# Patient Record
Sex: Female | Born: 1980 | Race: Black or African American | Hispanic: No | Marital: Married | Smoking: Never smoker
Health system: Southern US, Community
[De-identification: ages and names within clinical notes are randomized; demographics above are authoritative.]

## PROBLEM LIST (undated history)

## (undated) DIAGNOSIS — I10 Essential (primary) hypertension: Secondary | ICD-10-CM

## (undated) HISTORY — PX: ABDOMINAL HYSTERECTOMY: SHX81

---

## 2015-09-05 ENCOUNTER — Emergency Department (HOSPITAL_COMMUNITY)
Admission: EM | Admit: 2015-09-05 | Discharge: 2015-09-05 | Disposition: A | Discharge status: Home / Self Care | Payer: BLUE CROSS/BLUE SHIELD | Attending: Emergency Medicine | Admitting: Emergency Medicine

## 2015-09-05 ENCOUNTER — Emergency Department (HOSPITAL_COMMUNITY): Payer: BLUE CROSS/BLUE SHIELD

## 2015-09-05 ENCOUNTER — Encounter (HOSPITAL_COMMUNITY): Payer: Self-pay | Admitting: Emergency Medicine

## 2015-09-05 DIAGNOSIS — I10 Essential (primary) hypertension: Secondary | ICD-10-CM | POA: Diagnosis not present

## 2015-09-05 DIAGNOSIS — S92911A Unspecified fracture of right toe(s), initial encounter for closed fracture: Secondary | ICD-10-CM

## 2015-09-05 DIAGNOSIS — Y998 Other external cause status: Secondary | ICD-10-CM | POA: Diagnosis not present

## 2015-09-05 DIAGNOSIS — S99921A Unspecified injury of right foot, initial encounter: Secondary | ICD-10-CM | POA: Diagnosis present

## 2015-09-05 DIAGNOSIS — S92511A Displaced fracture of proximal phalanx of right lesser toe(s), initial encounter for closed fracture: Secondary | ICD-10-CM | POA: Diagnosis not present

## 2015-09-05 DIAGNOSIS — S90122A Contusion of left lesser toe(s) without damage to nail, initial encounter: Secondary | ICD-10-CM

## 2015-09-05 DIAGNOSIS — S91112A Laceration without foreign body of left great toe without damage to nail, initial encounter: Secondary | ICD-10-CM | POA: Diagnosis not present

## 2015-09-05 DIAGNOSIS — W182XXA Fall in (into) shower or empty bathtub, initial encounter: Secondary | ICD-10-CM | POA: Diagnosis not present

## 2015-09-05 DIAGNOSIS — Y93E1 Activity, personal bathing and showering: Secondary | ICD-10-CM | POA: Diagnosis not present

## 2015-09-05 DIAGNOSIS — Y9289 Other specified places as the place of occurrence of the external cause: Secondary | ICD-10-CM | POA: Insufficient documentation

## 2015-09-05 DIAGNOSIS — W19XXXA Unspecified fall, initial encounter: Secondary | ICD-10-CM

## 2015-09-05 DIAGNOSIS — S3992XA Unspecified injury of lower back, initial encounter: Secondary | ICD-10-CM | POA: Diagnosis not present

## 2015-09-05 DIAGNOSIS — S90112A Contusion of left great toe without damage to nail, initial encounter: Secondary | ICD-10-CM | POA: Diagnosis not present

## 2015-09-05 DIAGNOSIS — M545 Low back pain, unspecified: Secondary | ICD-10-CM

## 2015-09-05 DIAGNOSIS — M79676 Pain in unspecified toe(s): Secondary | ICD-10-CM

## 2015-09-05 HISTORY — DX: Essential (primary) hypertension: I10

## 2015-09-05 MED ORDER — IBUPROFEN 800 MG PO TABS: 800.0000 mg | ORAL_TABLET | Freq: Three times a day (TID) | ORAL | Status: AC

## 2015-09-05 MED ORDER — HYDROCODONE-ACETAMINOPHEN 5-325 MG PO TABS: 1.0000 | ORAL_TABLET | Freq: Four times a day (QID) | ORAL | Status: AC | PRN

## 2015-09-05 MED ORDER — CYCLOBENZAPRINE HCL 10 MG PO TABS: 10.0000 mg | ORAL_TABLET | Freq: Two times a day (BID) | ORAL | Status: AC | PRN

## 2015-09-05 MED ORDER — HYDROCODONE-ACETAMINOPHEN 5-325 MG PO TABS
2.0000 | ORAL_TABLET | Freq: Once | ORAL | Status: AC
  Administered 2015-09-05: 2 via ORAL
  Filled 2015-09-05: qty 2

## 2015-09-05 NOTE — Progress Notes (Signed)
Orthopedic Tech Progress Note Patient Details:  Raven Petersen 08/10/1980 [id number]  Ortho Devices Type of Ortho Device: Buddy tape, Crutches, Postop shoe/boot Ortho Device/Splint Location: rle 4th toe buddy tape Ortho Device/Splint Interventions: Ordered, Application   [name], [name] 09/05/2015, 6:41 AM

## 2015-09-05 NOTE — ED Notes (Signed)
Pt to ED via [facility] after reported slipping and falling in the shower.  Pt c/o pain in left great toe at nail.  Pain to right 4th toe. And lower back pain

## 2015-09-05 NOTE — Discharge Instructions (Signed)
Toe Fracture A toe fracture is a break in one of the toe bones (phalanges). CAUSES This condition may be caused by:  Dropping a heavy object on your toe.  Stubbing your toe.  Overusing your toe or doing repetitive exercise.  Twisting or stretching your toe out of place. RISK FACTORS This condition is more likely to develop in people who:  Play contact sports.  Have a bone disease.  Have a low calcium level. SYMPTOMS The main symptoms of this condition are swelling and pain in the toe. The pain may get worse with standing or walking. Other symptoms include:  Bruising.  Stiffness.  Numbness.  A change in the way the toe looks.  Broken bones that poke through the skin.  Blood beneath the toenail. DIAGNOSIS This condition is diagnosed with a physical exam. You may also have X-rays. TREATMENT  Treatment for this condition depends on the type of fracture and its severity. Treatment may involve:  Taping the broken toe to a toe that is next to it (buddy taping). This is the most common treatment for fractures in which the bone has not moved out of place (nondisplaced fracture).  Wearing a shoe that has a wide, rigid sole to protect the toe and to limit its movement.  Wearing a walking cast.  Having a procedure to move the toe back into place.  Surgery. This may be needed:  If there are many pieces of broken bone that are out of place (displaced).  If the toe joint breaks.  If the bone breaks through the skin.  Physical therapy. This is done to help regain movement and strength in the toe. You may need follow-up X-rays to make sure that the bone is healing well and staying in position. HOME CARE INSTRUCTIONS If You Have a Cast:  Do not stick anything inside the cast to scratch your skin. Doing that increases your risk of infection.  Check the skin around the cast every day. Report any concerns to your health care provider. You may put lotion on dry skin around the  edges of the cast. Do not apply lotion to the skin underneath the cast.  Do not put pressure on any part of the cast until it is fully hardened. This may take several hours.  Keep the cast clean and dry. Bathing  Do not take baths, swim, or use a hot tub until your health care provider approves. Ask your health care provider if you can take showers. You may only be allowed to take sponge baths for bathing.  If your health care provider approves bathing and showering, cover the cast or bandage (dressing) with a watertight plastic bag to protect it from water. Do not let the cast or dressing get wet. Managing Pain, Stiffness, and Swelling  If you do not have a cast, apply ice to the injured area, if directed.  Put ice in a plastic bag.  Place a towel between your skin and the bag.  Leave the ice on for 20 minutes, 2-3 times per day.  Move your toes often to avoid stiffness and to lessen swelling.  Raise (elevate) the injured area above the level of your heart while you are sitting or lying down. Driving  Do not drive or operate heavy machinery while taking pain medicine.  Do not drive while wearing a cast on a foot that you use for driving. Activity  Return to your normal activities as directed by your health care provider. Ask your health care  provider what activities are safe for you.  Perform exercises daily as directed by your health care provider or physical therapist. Safety  Do not use the injured limb to support your body weight until your health care provider says that you can. Use crutches or other assistive devices as directed by your health care provider. General Instructions  If your toe was treated with buddy taping, follow your health care provider's instructions for changing the gauze and tape. Change it more often:  The gauze and tape get wet. If this happens, dry the space between the toes.  The gauze and tape are too tight and cause your toe to become pale  or numb.  Wear a protective shoe as directed by your health care provider. If you were not given a protective shoe, wear sturdy, supportive shoes. Your shoes should not pinch your toes and should not fit tightly against your toes.  Do not use any tobacco products, including cigarettes, chewing tobacco, or e-cigarettes. Tobacco can delay bone healing. If you need help quitting, ask your health care provider.  Take medicines only as directed by your health care provider.  Keep all follow-up visits as directed by your health care provider. This is important. SEEK MEDICAL CARE IF:  You have a fever.  Your pain medicine is not helping.  Your toe is cold.  Your toe is numb.  You still have pain after one week of rest and treatment.  You still have pain after your health care provider has said that you can start walking again.  You have pain, tingling, or numbness in your foot that is not going away. SEEK IMMEDIATE MEDICAL CARE IF:  You have severe pain.  You have redness or inflammation in your toe that is getting worse.  You have pain or numbness in your toe that is getting worse.  Your toe turns blue.   This information is not intended to replace advice given to you by your health care provider. Make sure you discuss any questions you have with your health care provider.   Document Released: 03/17/2000 Document Revised: 12/09/2014 Document Reviewed: 01/14/2014 Elsevier Interactive Patient Education 2016 [employer]  Contusion A contusion is a deep bruise. Contusions are the result of a blunt injury to tissues and muscle fibers under the skin. The injury causes bleeding under the skin. The skin overlying the contusion may turn blue, purple, or yellow. Minor injuries will give you a painless contusion, but more severe contusions may stay painful and swollen for a few weeks.  CAUSES  This condition is usually caused by a blow, trauma, or direct force to an area of the  body. SYMPTOMS  Symptoms of this condition include:  Swelling of the injured area.  Pain and tenderness in the injured area.  Discoloration. The area may have redness and then turn blue, purple, or yellow. DIAGNOSIS  This condition is diagnosed based on a physical exam and medical history. An X-ray, CT scan, or MRI may be needed to determine if there are any associated injuries, such as broken bones (fractures). TREATMENT  Specific treatment for this condition depends on what area of the body was injured. In general, the best treatment for a contusion is resting, icing, applying pressure to (compression), and elevating the injured area. This is often called the RICE strategy. Over-the-counter anti-inflammatory medicines may also be recommended for pain control.  HOME CARE INSTRUCTIONS   Rest the injured area.  If directed, apply ice to the injured area:  Put ice in a plastic bag.  Place a towel between your skin and the bag.  Leave the ice on for 20 minutes, 2-3 times per day.  If directed, apply light compression to the injured area using an elastic bandage. Make sure the bandage is not wrapped too tightly. Remove and reapply the bandage as directed by your health care provider.  If possible, raise (elevate) the injured area above the level of your heart while you are sitting or lying down.  Take over-the-counter and prescription medicines only as told by your health care provider. SEEK MEDICAL CARE IF:  Your symptoms do not improve after several days of treatment.  Your symptoms get worse.  You have difficulty moving the injured area. SEEK IMMEDIATE MEDICAL CARE IF:   You have severe pain.  You have numbness in a hand or foot.  Your hand or foot turns pale or cold.   This information is not intended to replace advice given to you by your health care provider. Make sure you discuss any questions you have with your health care provider.   Document Released: 12/28/2004  Document Revised: 12/09/2014 Document Reviewed: 08/05/2014 Elsevier Interactive Patient Education 2016 [employer]  Back Pain, Adult Back pain is very common. The pain often gets better over time. The cause of back pain is usually not dangerous. Most people can learn to manage their back pain on their own.  HOME CARE  Watch your back pain for any changes. The following actions may help to lessen any pain you are feeling:  Stay active. Start with short walks on flat ground if you can. Try to walk farther each day.  Exercise regularly as told by your doctor. Exercise helps your back heal faster. It also helps avoid future injury by keeping your muscles strong and flexible.  Do not sit, drive, or stand in one place for more than 30 minutes.  Do not stay in bed. Resting more than 1-2 days can slow down your recovery.  Be careful when you bend or lift an object. Use good form when lifting:  Bend at your knees.  Keep the object close to your body.  Do not twist.  Sleep on a firm mattress. Lie on your side, and bend your knees. If you lie on your back, put a pillow under your knees.  Take medicines only as told by your doctor.  Put ice on the injured area.  Put ice in a plastic bag.  Place a towel between your skin and the bag.  Leave the ice on for 20 minutes, 2-3 times a day for the first 2-3 days. After that, you can switch between ice and heat packs.  Avoid feeling anxious or stressed. Find good ways to deal with stress, such as exercise.  Maintain a healthy weight. Extra weight puts stress on your back. GET HELP IF:   You have pain that does not go away with rest or medicine.  You have worsening pain that goes down into your legs or buttocks.  You have pain that does not get better in one week.  You have pain at night.  You lose weight.  You have a fever or chills. GET HELP RIGHT AWAY IF:   You cannot control when you poop (bowel movement) or pee  (urinate).  Your arms or legs feel weak.  Your arms or legs lose feeling (numbness).  You feel sick to your stomach (nauseous) or throw up (vomit).  You have belly (abdominal) pain.  You feel like you may pass out (faint).   This information is not intended to replace advice given to you by your health care provider. Make sure you discuss any questions you have with your health care provider.   Document Released: 09/06/2007 Document Revised: 04/10/2014 Document Reviewed: 07/22/2013 Elsevier Interactive Patient Education [employer]

## 2015-09-05 NOTE — ED Provider Notes (Signed)
CSN: [id number]     Arrival date & time 09/05/15  0220 History   First MD Initiated Contact with Patient 09/05/15 0301     Chief Complaint  Patient presents with  . Toe Injury     (Consider location/radiation/quality/duration/timing/severity/associated sxs/prior Treatment) HPI   Raven Petersen is a 35 year old female who presents emergency department for evaluation of left great toe and right fourth toe injury which occurred when she slipped in the shower this evening. She did fall onto her buttocks and had gradual onset of low back pain. She denies any head injury, denies loss of consciousness, is not on blood thinners.  She was ambulatory after the fall.  Her left great toe is a small cut on it.  Her fourth toe on her right foot having pain, overall she rates her pain 10 on a 10, worse with movement of her toes, ambulation or palpation.  No alleviating sx.  She denies any deformity, swelling, numbness, weakness, tingling.  Her low back pain gradually began, is described as mild and achy.  She is able to walk.  No other acute complaints.  Past Medical History  Diagnosis Date  . Hypertension    Past Surgical History  Procedure Laterality Date  . Cesarean section    . Abdominal hysterectomy     No family history on file. Social History  Substance Use Topics  . Smoking status: Never Smoker   . Smokeless tobacco: None  . Alcohol Use: Yes     Comment: occasionally   OB History    No data available     Review of Systems  All other systems reviewed and are negative.     Allergies  Review of patient's allergies indicates not on file.  Home Medications   Prior to Admission medications   Medication Sig Start Date End Date Taking? Authorizing Provider  cyclobenzaprine (FLEXERIL) 10 MG tablet Take 1 tablet (10 mg total) by mouth 2 (two) times daily as needed for muscle spasms. 09/05/15   Danelle Berry, PA-C  HYDROcodone-acetaminophen (NORCO/VICODIN) 5-325 MG tablet Take 1-2 tablets  by mouth every 6 (six) hours as needed for severe pain. 09/05/15   Danelle Berry, PA-C  ibuprofen (ADVIL,MOTRIN) 800 MG tablet Take 1 tablet (800 mg total) by mouth 3 (three) times daily. 09/05/15   Danelle Berry, PA-C   BP 131/93 mmHg  Pulse 61  Temp(Src) 97.6 F (36.4 C) (Oral)  Resp 16  SpO2 100% Physical Exam  Constitutional: She is oriented to person, place, and time. She appears well-developed and well-nourished. No distress.  HENT:  Head: Normocephalic and atraumatic.  Right Ear: External ear normal.  Left Ear: External ear normal.  Nose: Nose normal.  Mouth/Throat: Oropharynx is clear and moist. No oropharyngeal exudate.  Eyes: Conjunctivae and EOM are normal. Pupils are equal, round, and reactive to light. Right eye exhibits no discharge. Left eye exhibits no discharge. No scleral icterus.  Neck: Normal range of motion. Neck supple. No JVD present. No tracheal deviation present.  Cardiovascular: Normal rate and regular rhythm.   Pulmonary/Chest: Effort normal and breath sounds normal. No stridor. No respiratory distress.  Musculoskeletal: Normal range of motion. She exhibits tenderness. She exhibits no edema.       Lumbar back: She exhibits tenderness. She exhibits normal range of motion, no bony tenderness, no swelling and no spasm.       Back:       Feet:  Lymphadenopathy:    She has no cervical adenopathy.  Neurological: She  is alert and oriented to person, place, and time. She exhibits normal muscle tone. Coordination normal.  Skin: Skin is warm. No rash noted. She is not diaphoretic. No erythema. No pallor.  Small laceration to left great distal tip of toe, no active bleeding  Psychiatric: She has a normal mood and affect. Her behavior is normal. Judgment and thought content normal.  Nursing note and vitals reviewed.   ED Course  Procedures (including critical care time) Labs Review Labs Reviewed - No data to display   LACERATION REPAIR Performed by: Danelle BerryLeisa  Timber Lucarelli Consent: Verbal consent obtained. Risks and benefits: risks, benefits and alternatives were discussed Patient identity confirmed: provided demographic data Time out performed prior to procedure Prepped and Draped in normal sterile fashion Laceration Location: left great toe Laceration Length: 1 cm No Foreign Bodies seen or palpated No anesthesia Irrigation method: syringe Amount of cleaning: standard Skin closure: dermabond Technique: edges approximated, skin adhesive applied, second layer added Patient tolerance: Patient tolerated the procedure well with no immediate complications.    Imaging Review Dg Toe 4th Right  09/05/2015  CLINICAL DATA:  Status post injury to the right fourth toe in shower. Initial encounter. EXAM: RIGHT FOURTH TOE COMPARISON:  None. FINDINGS: There is a mildly comminuted fracture involving the right fourth proximal phalanx, extending distally to the proximal interphalangeal joint. Associated soft tissue swelling is noted. Remaining visualized joint spaces are preserved. IMPRESSION: Mildly comminuted fracture involving the right fourth proximal phalanx, extending distally to the proximal interphalangeal joint. Electronically Signed   By: Roanna RaiderJeffery  Chang M.D.   On: 09/05/2015 04:37   Dg Toe Great Left  09/05/2015  CLINICAL DATA:  Status post injury to the left great toe in shower. Initial encounter. EXAM: LEFT GREAT TOE COMPARISON:  None. FINDINGS: There is no evidence of fracture or dislocation. Visualized joint spaces are preserved. Known soft tissue injury is partially characterized. No radiopaque foreign bodies are seen. IMPRESSION: No evidence of fracture or dislocation. Electronically Signed   By: Roanna RaiderJeffery  Chang M.D.   On: 09/05/2015 04:36   I have personally reviewed and evaluated these images and lab results as part of my medical decision-making.   EKG Interpretation None      MDM   Pt with mechanical, slip and fall in shower with toe pain to right  4th toe and left great toe, gradual onset low back pain, no head injury, no LOC, no blood thinner use.  Xray pertinent for Right fourth toe mildly comminuted fracture of proximal phalanx extending distally to the PIP joint.  Patient was buddy taped with small splint and placed in postop shoe and given crutches.  Left great toe negative for fracture dislocation. Nail polish was removed and nail bed was without subungual hematoma.  Small laceration extending from medial nail fold to the distal toe was repaired with Dermabond.  No midline tenderness, muscular low back pain, will give pain meds, NSAIDs, muscle relaxers, work note, encouraged to follow-up PCP as needed and see ortho.    Final diagnoses:  Toe pain  Toe fracture, right, closed, initial encounter  Toe contusion, left, initial encounter  Bilateral low back pain without sciatica  Fall, initial encounter     Danelle BerryLeisa Otniel Hoe, PA-C 09/05/15 0649  Richardean Canalavid H Yao, MD 09/05/15 [phone]

## 2017-12-24 IMAGING — DX DG TOE 4TH 2+V*R*
4 series · 4 of 4 positions shown · non-contrast
Comparison: None.

CLINICAL DATA: Status post injury to the right fourth toe in
shower. Initial encounter.

EXAM:
RIGHT FOURTH TOE

[toe ap]
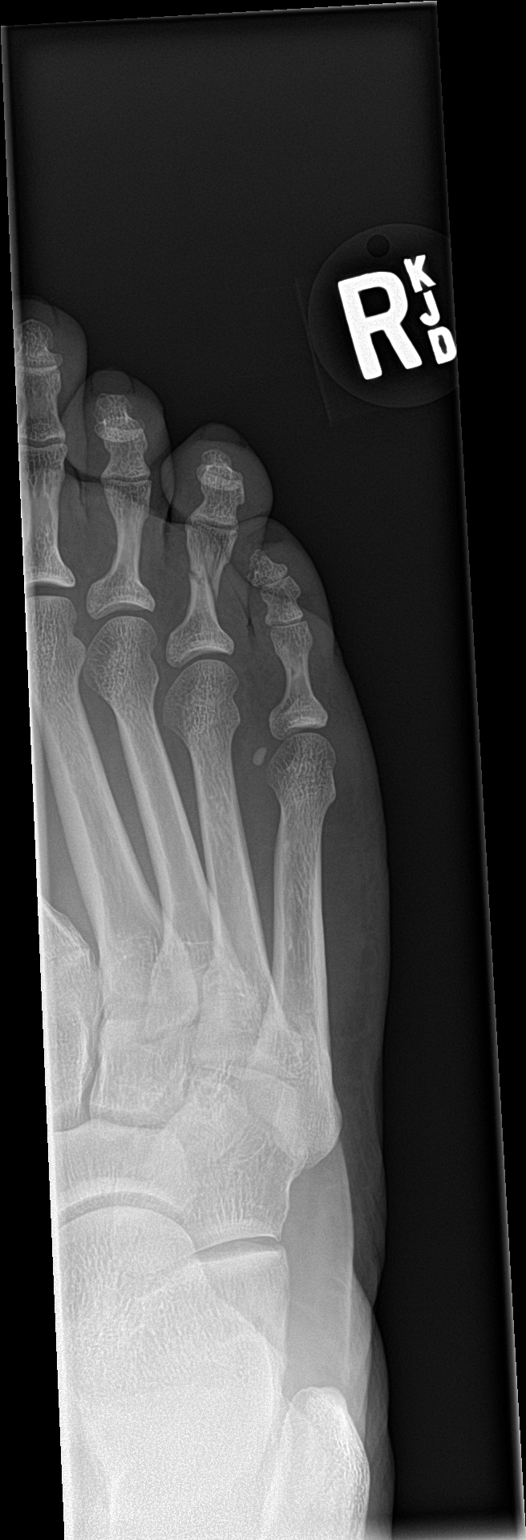

[toe obl]
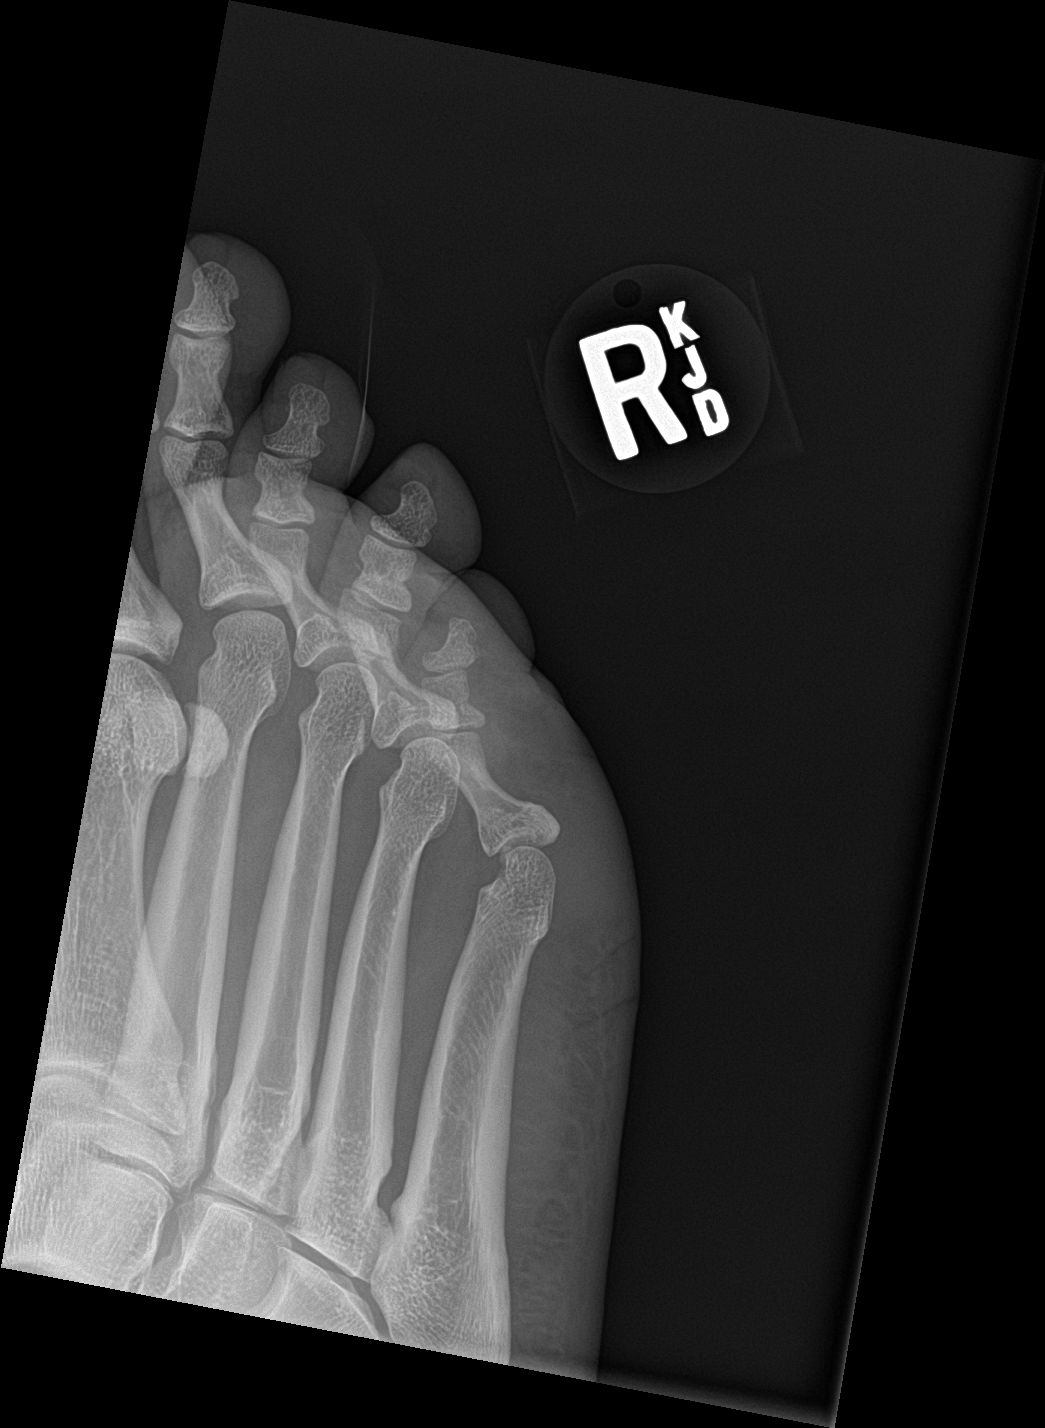

[toe lat (1 of 2)]
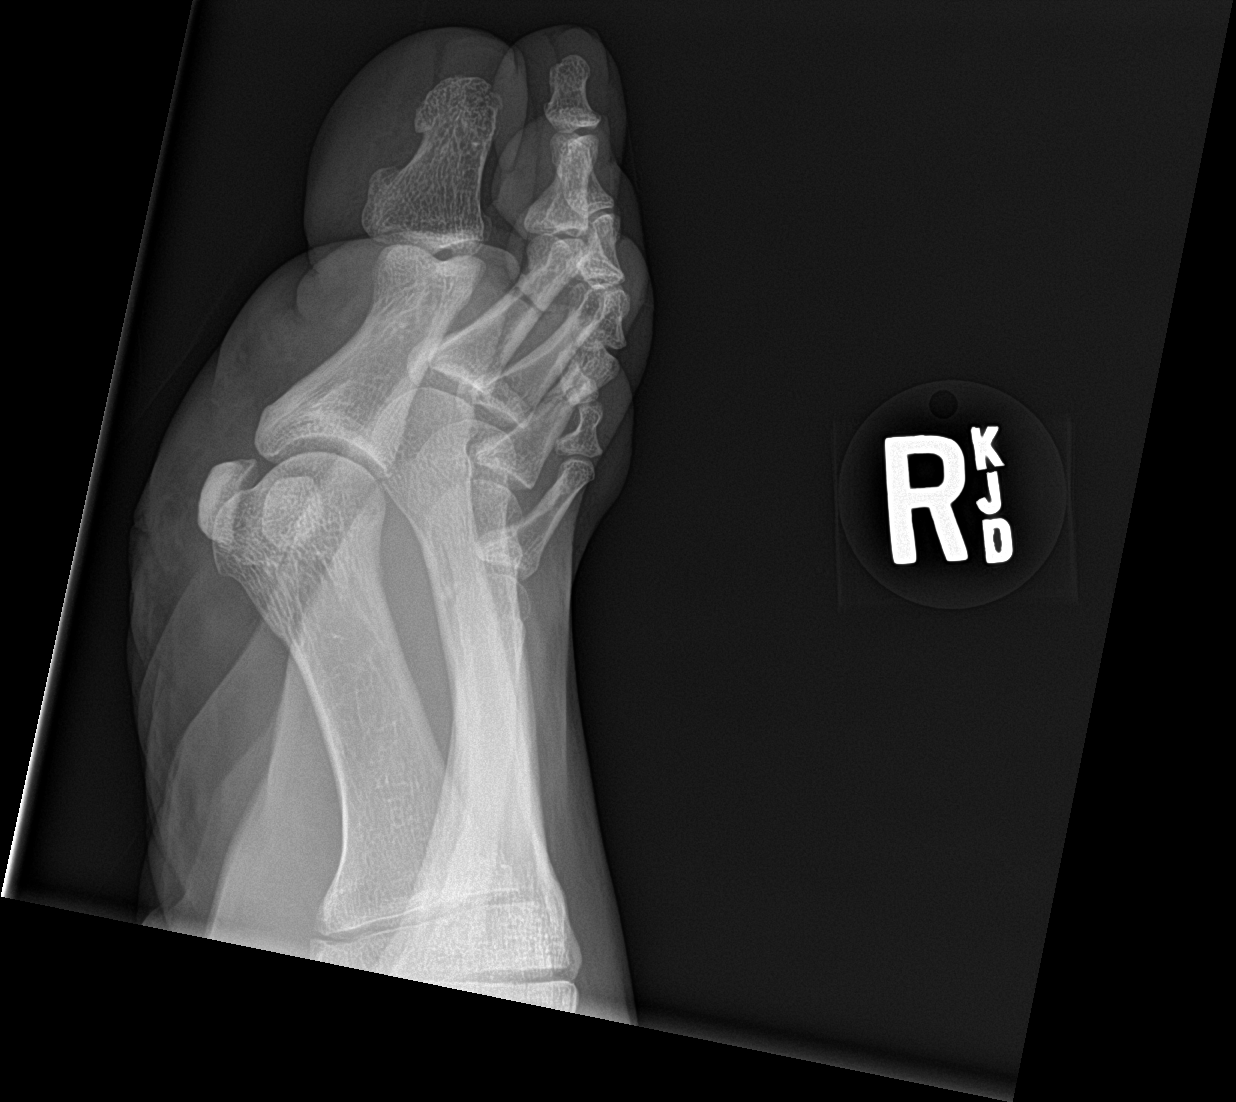

[toe lat (2 of 2)]
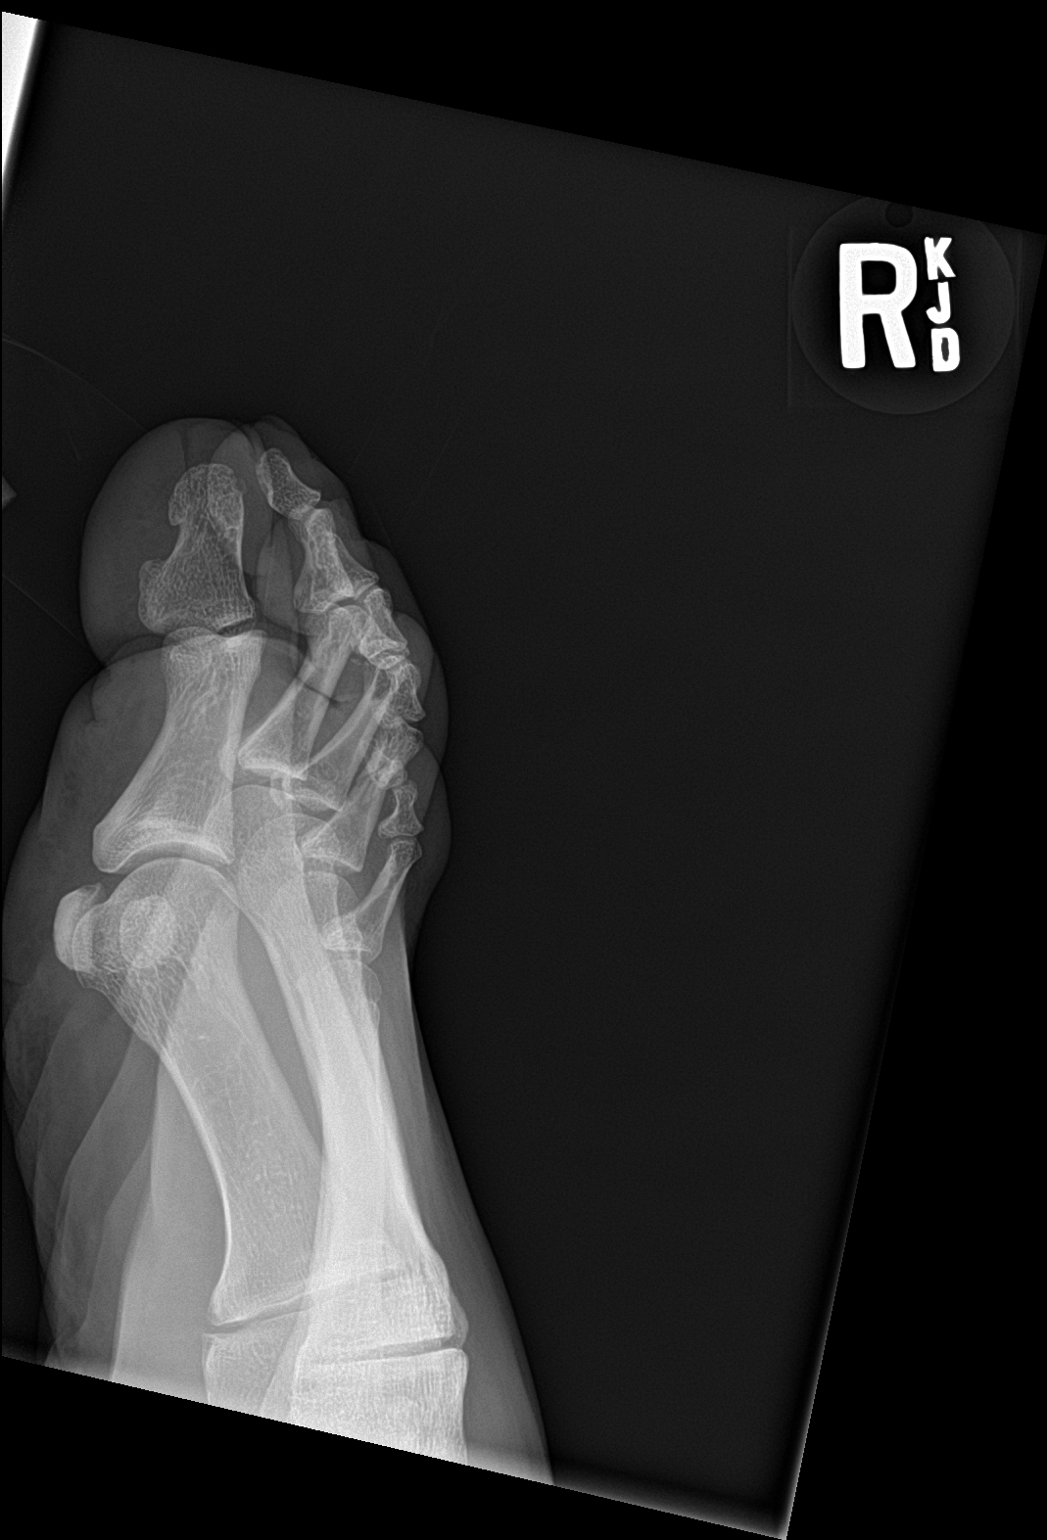

[4 of 4 positions shown; findings below may reference images not displayed]

FINDINGS: There is a mildly comminuted fracture involving the right fourth
proximal phalanx, extending distally to the proximal interphalangeal
joint. Associated soft tissue swelling is noted. Remaining
visualized joint spaces are preserved.
IMPRESSION: Mildly comminuted fracture involving the right fourth proximal
phalanx, extending distally to the proximal interphalangeal joint.
